# Patient Record
Sex: Female | Born: 1960 | Race: White | Hispanic: No | Marital: Single | Smoking: Former smoker
Health system: Southern US, Community
[De-identification: ages and names within clinical notes are randomized; demographics above are authoritative.]

## PROBLEM LIST (undated history)

## (undated) DIAGNOSIS — N200 Calculus of kidney: Secondary | ICD-10-CM

## (undated) HISTORY — PX: ECTOPIC PREGNANCY SURGERY: SHX613

---

## 1998-11-24 ENCOUNTER — Emergency Department (HOSPITAL_COMMUNITY): Admission: EM | Admit: 1998-11-24 | Discharge: 1998-11-24 | Payer: Self-pay | Admitting: *Deleted

## 1998-11-25 ENCOUNTER — Encounter: Payer: Self-pay | Admitting: *Deleted

## 2000-05-22 ENCOUNTER — Other Ambulatory Visit: Admission: RE | Admit: 2000-05-22 | Discharge: 2000-05-22 | Payer: Self-pay | Admitting: Family Medicine

## 2005-06-14 ENCOUNTER — Other Ambulatory Visit: Admission: RE | Admit: 2005-06-14 | Discharge: 2005-06-14 | Payer: Self-pay | Admitting: Family Medicine

## 2005-06-14 ENCOUNTER — Ambulatory Visit: Payer: Self-pay | Admitting: Family Medicine

## 2005-06-24 ENCOUNTER — Ambulatory Visit: Payer: Self-pay | Admitting: Family Medicine

## 2005-10-12 ENCOUNTER — Ambulatory Visit: Payer: Self-pay | Admitting: Family Medicine

## 2015-09-08 ENCOUNTER — Encounter (HOSPITAL_BASED_OUTPATIENT_CLINIC_OR_DEPARTMENT_OTHER): Payer: Self-pay

## 2015-09-08 ENCOUNTER — Emergency Department (HOSPITAL_BASED_OUTPATIENT_CLINIC_OR_DEPARTMENT_OTHER): Payer: 59

## 2015-09-08 ENCOUNTER — Emergency Department (HOSPITAL_BASED_OUTPATIENT_CLINIC_OR_DEPARTMENT_OTHER)
Admission: EM | Admit: 2015-09-08 | Discharge: 2015-09-08 | Disposition: A | Discharge status: Home / Self Care | Payer: 59 | Attending: Emergency Medicine | Admitting: Emergency Medicine

## 2015-09-08 DIAGNOSIS — Z87442 Personal history of urinary calculi: Secondary | ICD-10-CM | POA: Diagnosis not present

## 2015-09-08 DIAGNOSIS — Z3202 Encounter for pregnancy test, result negative: Secondary | ICD-10-CM | POA: Diagnosis not present

## 2015-09-08 DIAGNOSIS — Z87891 Personal history of nicotine dependence: Secondary | ICD-10-CM | POA: Diagnosis not present

## 2015-09-08 DIAGNOSIS — Z79899 Other long term (current) drug therapy: Secondary | ICD-10-CM | POA: Diagnosis not present

## 2015-09-08 DIAGNOSIS — R109 Unspecified abdominal pain: Secondary | ICD-10-CM

## 2015-09-08 DIAGNOSIS — M545 Low back pain: Secondary | ICD-10-CM | POA: Insufficient documentation

## 2015-09-08 HISTORY — DX: Calculus of kidney: N20.0

## 2015-09-08 LAB — CBC WITH DIFFERENTIAL/PLATELET
Basophils Absolute: 0 10*3/uL (ref 0.0–0.1)
Basophils Relative: 0 %
EOS PCT: 4 %
Eosinophils Absolute: 0.2 10*3/uL (ref 0.0–0.7)
HEMATOCRIT: 38.9 % (ref 36.0–46.0)
Hemoglobin: 12.8 g/dL (ref 12.0–15.0)
LYMPHS ABS: 2.6 10*3/uL (ref 0.7–4.0)
LYMPHS PCT: 39 %
MCH: 26.6 pg (ref 26.0–34.0)
MCHC: 32.9 g/dL (ref 30.0–36.0)
MCV: 80.7 fL (ref 78.0–100.0)
MONO ABS: 0.5 10*3/uL (ref 0.1–1.0)
MONOS PCT: 8 %
NEUTROS ABS: 3.3 10*3/uL (ref 1.7–7.7)
Neutrophils Relative %: 49 %
PLATELETS: 212 10*3/uL (ref 150–400)
RBC: 4.82 MIL/uL (ref 3.87–5.11)
RDW: 13 % (ref 11.5–15.5)
WBC: 6.7 10*3/uL (ref 4.0–10.5)

## 2015-09-08 LAB — BASIC METABOLIC PANEL
ANION GAP: 7 (ref 5–15)
BUN: 15 mg/dL (ref 6–20)
CO2: 26 mmol/L (ref 22–32)
Calcium: 8.9 mg/dL (ref 8.9–10.3)
Chloride: 104 mmol/L (ref 101–111)
Creatinine, Ser: 0.52 mg/dL (ref 0.44–1.00)
GFR calc Af Amer: >60 mL/min (ref >60)
GLUCOSE: 134 mg/dL — AB (ref 65–99)
POTASSIUM: 3.9 mmol/L (ref 3.5–5.1)
Sodium: 137 mmol/L (ref 135–145)

## 2015-09-08 LAB — URINALYSIS, ROUTINE W REFLEX MICROSCOPIC
Bilirubin Urine: NEGATIVE
Glucose, UA: NEGATIVE mg/dL
Hgb urine dipstick: NEGATIVE
Ketones, ur: NEGATIVE mg/dL
Leukocytes, UA: NEGATIVE
Nitrite: NEGATIVE
Protein, ur: NEGATIVE mg/dL
Specific Gravity, Urine: 1.023 (ref 1.005–1.030)
Urobilinogen, UA: 0.2 mg/dL (ref 0.0–1.0)
pH: 5 (ref 5.0–8.0)

## 2015-09-08 LAB — PREGNANCY, URINE: Preg Test, Ur: NEGATIVE

## 2015-09-08 MED ORDER — FENTANYL CITRATE (PF) 100 MCG/2ML IJ SOLN: 100.0000 ug | Freq: Once | INTRAMUSCULAR | Status: DC

## 2015-09-08 MED ORDER — KETOROLAC TROMETHAMINE 30 MG/ML IJ SOLN
30.0000 mg | Freq: Once | INTRAMUSCULAR | Status: AC
  Administered 2015-09-08: 30 mg via INTRAVENOUS
  Filled 2015-09-08: qty 1

## 2015-09-08 NOTE — ED Provider Notes (Signed)
CSN: [id number]     Arrival date & time 09/08/15  1302 History   First MD Initiated Contact with Patient 09/08/15 1316     Chief Complaint  Patient presents with  . Back Pain     (Consider location/radiation/quality/duration/timing/severity/associated sxs/prior Treatment) Patient is a 54 y.o. female presenting with back pain. The history is provided by the patient.  Back Pain Associated symptoms: no abdominal pain, no chest pain, no headaches, no numbness and no weakness    patient presents with back pain. Started on her left flank while cleaning today. States there was no injury. It has been severe. Worse with movements. No dysuria. States it feels somewhat like previous kidney stone she had. States she was seen at an outside hospital for previous stone. No chest pain. No trouble breathing. No fevers the pain is sharp and worse with movements. Nothing makes it go away although it is somewhat improved with sitting still.   Past Medical History  Diagnosis Date  . Kidney stone    Past Surgical History  Procedure Laterality Date  . Cesarean section    . Ectopic pregnancy surgery     No family history on file. Social History  Substance Use Topics  . Smoking status: Former Games developer  . Smokeless tobacco: None  . Alcohol Use: No   OB History    No data available     Review of Systems  Constitutional: Negative for activity change and appetite change.  Eyes: Negative for pain.  Respiratory: Negative for chest tightness and shortness of breath.   Cardiovascular: Negative for chest pain and leg swelling.  Gastrointestinal: Negative for nausea, vomiting, abdominal pain and diarrhea.  Genitourinary: Positive for flank pain.  Musculoskeletal: Positive for back pain. Negative for neck stiffness.  Skin: Negative for rash.  Neurological: Negative for weakness, numbness and headaches.  Psychiatric/Behavioral: Negative for behavioral problems.      Allergies  Vicodin  Home Medications    Prior to Admission medications   Medication Sig Start Date End Date Taking? Authorizing Provider  omeprazole (PRILOSEC) 40 MG capsule Take 40 mg by mouth daily.   Yes Historical Provider, MD  RaNITidine HCl (ZANTAC PO) Take by mouth.   Yes Historical Provider, MD   BP 131/54 mmHg  Pulse 74  Temp(Src) 97.6 F (36.4 C) (Oral)  Resp 18  Ht  (1.626 m)  Wt 170 lb (77.111 kg)  BMI 29.17 kg/m2  SpO2 97% Physical Exam  Constitutional: She is oriented to person, place, and time. She appears well-developed and well-nourished.  HENT:  Head: Normocephalic and atraumatic.  Eyes: Pupils are equal, round, and reactive to light.  Neck: Normal range of motion.  Cardiovascular: Normal rate, regular rhythm and normal heart sounds.   No murmur heard. Pulmonary/Chest: Effort normal and breath sounds normal. No respiratory distress. She has no wheezes. She has no rales.  Abdominal: Soft. Bowel sounds are normal. She exhibits no distension. There is no tenderness.  Genitourinary:  CVA tenderness on left.  Musculoskeletal: Normal range of motion.  Neurological: She is alert and oriented to person, place, and time. No cranial nerve deficit.  Skin: Skin is warm and dry.  Psychiatric: Her speech is normal.  Nursing note and vitals reviewed.   ED Course  Procedures (including critical care time) Labs Review Labs Reviewed  URINALYSIS, ROUTINE W REFLEX MICROSCOPIC (NOT AT [facility])  PREGNANCY, URINE  BASIC METABOLIC PANEL  CBC WITH DIFFERENTIAL/PLATELET    Imaging Review No results found. I have personally  reviewed and evaluated these images and lab results as part of my medical decision-making.   EKG Interpretation None      MDM   Final diagnoses:  Left flank pain   Patient with left flank pain. History of ureteral stones. Negative CT here. Feels better after treatment. Likely musculoskeletal. Will discharge home.    Benjiman Core, MD 09/08/15 1501

## 2015-09-08 NOTE — Discharge Instructions (Signed)
Flank Pain °Flank pain refers to pain that is located on the side of the body between the upper abdomen and the back. The pain may occur over a short period of time (acute) or may be long-term or reoccurring (chronic). It may be mild or severe. Flank pain can be caused by many things. °CAUSES  °Some of the more common causes of flank pain include: °· Muscle strains.   °· Muscle spasms.   °· A disease of your spine (vertebral disk disease).   °· A lung infection (pneumonia).   °· Fluid around your lungs (pulmonary edema).   °· A kidney infection.   °· Kidney stones.   °· A very painful skin rash caused by the chickenpox virus (shingles).   °· Gallbladder disease.   °HOME CARE INSTRUCTIONS  °Home care will depend on the cause of your pain. In general, °· Rest as directed by your caregiver. °· Drink enough fluids to keep your urine clear or pale yellow. °· Only take over-the-counter or prescription medicines as directed by your caregiver. Some medicines may help relieve the pain. °· Tell your caregiver about any changes in your pain. °· Follow up with your caregiver as directed. °SEEK IMMEDIATE MEDICAL CARE IF:  °· Your pain is not controlled with medicine.   °· You have new or worsening symptoms. °· Your pain increases.   °· You have abdominal pain.   °· You have shortness of breath.   °· You have persistent nausea or vomiting.   °· You have swelling in your abdomen.   °· You feel faint or pass out.   °· You have blood in your urine. °· You have a fever or persistent symptoms for more than 2-3 days. °· You have a fever and your symptoms suddenly get worse. °MAKE SURE YOU:  °· Understand these instructions. °· Will watch your condition. °· Will get help right away if you are not doing well or get worse. °Document Released: 01/26/2006 Document Revised: 08/29/2012 Document Reviewed: 07/19/2012 °Exit[employer]® Patient Information ©2015 [employer]. This information is not intended to replace advice given to you by your  health care provider. Make sure you discuss any questions you have with your health care provider. ° °

## 2015-09-08 NOTE — ED Notes (Addendum)
C/o left lower/mid back pain x 1 hour-started while cleaning house-states feels like kidney stone pain-pt did drive self to ED-presents to triage in w/c

## 2015-09-08 NOTE — ED Notes (Signed)
Pt states she does not have ride

## 2015-09-08 NOTE — ED Notes (Signed)
Patient transported to CT 

## 2015-11-16 ENCOUNTER — Encounter: Payer: Self-pay | Admitting: Podiatry

## 2015-11-16 ENCOUNTER — Ambulatory Visit (INDEPENDENT_AMBULATORY_CARE_PROVIDER_SITE_OTHER): Payer: 59

## 2015-11-16 ENCOUNTER — Ambulatory Visit (INDEPENDENT_AMBULATORY_CARE_PROVIDER_SITE_OTHER): Payer: 59 | Admitting: Podiatry

## 2015-11-16 VITALS — BP 128/76 | HR 74 | Resp 12

## 2015-11-16 DIAGNOSIS — M2022 Hallux rigidus, left foot: Secondary | ICD-10-CM | POA: Diagnosis not present

## 2015-11-16 DIAGNOSIS — R52 Pain, unspecified: Secondary | ICD-10-CM

## 2015-11-16 DIAGNOSIS — M2021 Hallux rigidus, right foot: Secondary | ICD-10-CM | POA: Diagnosis not present

## 2015-11-16 DIAGNOSIS — M722 Plantar fascial fibromatosis: Secondary | ICD-10-CM | POA: Diagnosis not present

## 2015-11-16 MED ORDER — MELOXICAM 15 MG PO TABS: 15.0000 mg | ORAL_TABLET | Freq: Every day | ORAL | Status: DC

## 2015-11-16 NOTE — Progress Notes (Signed)
   Subjective:    Patient ID: Grace Navarro, female    DOB: 1961/05/03, 54 y.o.   MRN: [id number]  HPI this 54 year old female presents to the office with chief complaint of pain noted in her right heel. She states that the pain has been present for approximately 2 months. She states pain is present upon rising in the morning and standing from a sitting position. She states she has throbbing pain noted in the evening. She has been performing stretches recommended by her medical doctor. He also recommended that she make an appointment for an evaluation and treatment of her right heel. She denies any history of trauma or injury to the foot and presents the office for an evaluation and treatment Patient is here today with right heel pain.   Review of Systems  Neurological: Positive for headaches.  Hematological: Bruises/bleeds easily.       Objective:   Physical Exam GENERAL APPEARANCE: Alert, conversant. Appropriately groomed. No acute distress.  VASCULAR: Pedal pulses palpable at  [facility] bilateral.  Capillary refill time is immediate to all digits,  Normal temperature gradient.  Digital hair growth is present bilateral  NEUROLOGIC: sensation is normal to 5.07 monofilament at 5/5 sites bilateral.  Light touch is intact bilateral, Muscle strength normal.  MUSCULOSKELETAL: acceptable muscle strength, tone and stability bilateral.  Intrinsic muscluature intact bilateral.  Rectus appearance of foot and digits noted bilateral. Palpable pain at the insertion plantar fascia right heel.  Swelling localized right heel.  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  DERMATOLOGIC: skin color, texture, and turgor are within normal limits.  No preulcerative lesions or ulcers  are seen, no interdigital maceration noted.  No open lesions present.  Digital nails are asymptomatic. No drainage noted.         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  Prescribed Mobic.  Injection therapy right heel.  Stretching exercises discussed.  RTC 2 weeks.  X-rays reveal calcification plantar fascia right heel with long first metatarsal right foot. RTC 2 weeks.  Helane GuntherGregory Kassia Demarinis DPM

## 2015-11-30 ENCOUNTER — Ambulatory Visit (INDEPENDENT_AMBULATORY_CARE_PROVIDER_SITE_OTHER): Payer: 59 | Admitting: Podiatry

## 2015-11-30 ENCOUNTER — Encounter: Payer: Self-pay | Admitting: Podiatry

## 2015-11-30 VITALS — BP 142/72 | HR 75 | Resp 14

## 2015-11-30 DIAGNOSIS — M722 Plantar fascial fibromatosis: Secondary | ICD-10-CM

## 2015-11-30 NOTE — Progress Notes (Signed)
Subjective:     Patient ID: Grace Navarro, female   DOB: [birth year], 54 y.o.   MRN: [id number]  HPI this patient returns to the office follow-up for diagnosis of plantar fasciitis right foot. She has been taking Mobic and received an injection in addition to wearing her pure stride insoles. She states that she is doing much better and even says 100% improvement. She is not having pain after work nor in the morning upon rising. She is very pleased with her progress Review of Systems     Objective:   Physical Exam Physical Exam GENERAL APPEARANCE: Alert, conversant. Appropriately groomed. No acute distress.  VASCULAR: [facility]. Capillary refill time is immediate to all digits, Normal temperature gradient. Digital hair growth is present bilateral  NEUROLOGIC: sensation is normal to 5.07 monofilament at 5/5 sites bilateral. Light touch is intact bilateral, Muscle strength normal.  MUSCULOSKELETAL: acceptable muscle strength, tone and stability bilateral. Intrinsic muscluature intact bilateral. Rectus appearance of foot and digits noted bilateral. Swelling and pain right heel has resolved. 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  DERMATOLOGIC: skin color, texture, and turgor are within normal limits. No preulcerative lesions or ulcers are seen, no interdigital maceration noted. No open lesions present. Digital nails are asymptomatic. No drainage noted.    Assessment:     Plantat fascitis right heel     Plan:     ROV  Discussed orthotic therapy.  RTC prn.  Helane GuntherGregory Kwinton Maahs DPM

## 2017-04-26 ENCOUNTER — Ambulatory Visit: Payer: Self-pay | Admitting: Sports Medicine

## 2018-07-28 ENCOUNTER — Emergency Department (HOSPITAL_COMMUNITY): Payer: Self-pay

## 2018-07-28 ENCOUNTER — Encounter (HOSPITAL_COMMUNITY): Payer: Self-pay

## 2018-07-28 ENCOUNTER — Emergency Department (HOSPITAL_COMMUNITY)
Admission: EM | Admit: 2018-07-28 | Discharge: 2018-07-28 | Disposition: A | Discharge status: Home / Self Care | Payer: Self-pay | Attending: Emergency Medicine | Admitting: Emergency Medicine

## 2018-07-28 ENCOUNTER — Other Ambulatory Visit: Payer: Self-pay

## 2018-07-28 DIAGNOSIS — Z87891 Personal history of nicotine dependence: Secondary | ICD-10-CM | POA: Insufficient documentation

## 2018-07-28 DIAGNOSIS — Z79899 Other long term (current) drug therapy: Secondary | ICD-10-CM | POA: Insufficient documentation

## 2018-07-28 DIAGNOSIS — Z7984 Long term (current) use of oral hypoglycemic drugs: Secondary | ICD-10-CM | POA: Insufficient documentation

## 2018-07-28 DIAGNOSIS — R109 Unspecified abdominal pain: Secondary | ICD-10-CM

## 2018-07-28 DIAGNOSIS — R1030 Lower abdominal pain, unspecified: Secondary | ICD-10-CM | POA: Insufficient documentation

## 2018-07-28 LAB — CBC
HEMATOCRIT: 44.9 % (ref 36.0–46.0)
Hemoglobin: 14.2 g/dL (ref 12.0–15.0)
MCH: 25.9 pg — ABNORMAL LOW (ref 26.0–34.0)
MCHC: 31.6 g/dL (ref 30.0–36.0)
MCV: 81.9 fL (ref 78.0–100.0)
Platelets: 255 10*3/uL (ref 150–400)
RBC: 5.48 MIL/uL — ABNORMAL HIGH (ref 3.87–5.11)
RDW: 13.3 % (ref 11.5–15.5)
WBC: 9.5 10*3/uL (ref 4.0–10.5)

## 2018-07-28 LAB — COMPREHENSIVE METABOLIC PANEL
ALBUMIN: 3.9 g/dL (ref 3.5–5.0)
ALK PHOS: 85 U/L (ref 38–126)
ALT: 23 U/L (ref 0–44)
AST: 17 U/L (ref 15–41)
Anion gap: 10 (ref 5–15)
BILIRUBIN TOTAL: 0.7 mg/dL (ref 0.3–1.2)
BUN: 8 mg/dL (ref 6–20)
CALCIUM: 9.5 mg/dL (ref 8.9–10.3)
CO2: 23 mmol/L (ref 22–32)
Chloride: 104 mmol/L (ref 98–111)
Creatinine, Ser: 0.54 mg/dL (ref 0.44–1.00)
GFR calc Af Amer: >60 mL/min (ref >60)
GLUCOSE: 141 mg/dL — AB (ref 70–99)
POTASSIUM: 4 mmol/L (ref 3.5–5.1)
Sodium: 137 mmol/L (ref 135–145)
TOTAL PROTEIN: 6.9 g/dL (ref 6.5–8.1)

## 2018-07-28 LAB — URINALYSIS, ROUTINE W REFLEX MICROSCOPIC
BILIRUBIN URINE: NEGATIVE
Glucose, UA: NEGATIVE mg/dL
Hgb urine dipstick: NEGATIVE
KETONES UR: NEGATIVE mg/dL
LEUKOCYTES UA: NEGATIVE
NITRITE: NEGATIVE
Protein, ur: NEGATIVE mg/dL
Specific Gravity, Urine: 1.02 (ref 1.005–1.030)
pH: 5 (ref 5.0–8.0)

## 2018-07-28 LAB — LIPASE, BLOOD: Lipase: 27 U/L (ref 11–51)

## 2018-07-28 LAB — I-STAT BETA HCG BLOOD, ED (MC, WL, AP ONLY)

## 2018-07-28 MED ORDER — HYDROMORPHONE HCL 1 MG/ML IJ SOLN
0.5000 mg | Freq: Once | INTRAMUSCULAR | Status: AC
  Administered 2018-07-28: 0.5 mg via INTRAVENOUS
  Filled 2018-07-28: qty 1

## 2018-07-28 MED ORDER — IOHEXOL 300 MG/ML  SOLN
100.0000 mL | Freq: Once | INTRAMUSCULAR | Status: AC | PRN
  Administered 2018-07-28: 100 mL via INTRAVENOUS

## 2018-07-28 MED ORDER — PROMETHAZINE HCL 25 MG/ML IJ SOLN
12.5000 mg | Freq: Once | INTRAMUSCULAR | Status: AC
  Administered 2018-07-28: 12.5 mg via INTRAVENOUS
  Filled 2018-07-28: qty 1

## 2018-07-28 MED ORDER — ONDANSETRON HCL 4 MG/2ML IJ SOLN
4.0000 mg | Freq: Once | INTRAMUSCULAR | Status: AC
  Administered 2018-07-28: 4 mg via INTRAVENOUS
  Filled 2018-07-28: qty 2

## 2018-07-28 MED ORDER — SODIUM CHLORIDE 0.9 % IV BOLUS
1000.0000 mL | Freq: Once | INTRAVENOUS | Status: AC
Start: 2018-07-28 — End: 2018-07-28
  Administered 2018-07-28: 1000 mL via INTRAVENOUS

## 2018-07-28 MED ORDER — FAMOTIDINE IN NACL 20-0.9 MG/50ML-% IV SOLN
20.0000 mg | Freq: Once | INTRAVENOUS | Status: AC
  Administered 2018-07-28: 20 mg via INTRAVENOUS
  Filled 2018-07-28: qty 50

## 2018-07-28 MED ORDER — SODIUM CHLORIDE 0.9 % IV BOLUS
1000.0000 mL | Freq: Once | INTRAVENOUS | Status: AC
  Administered 2018-07-28: 1000 mL via INTRAVENOUS

## 2018-07-28 MED ORDER — ONDANSETRON HCL 8 MG PO TABS: 8.0000 mg | ORAL_TABLET | Freq: Three times a day (TID) | ORAL | 0 refills | Status: DC | PRN

## 2018-07-28 NOTE — Discharge Instructions (Addendum)
CT scan showed no acute findings.  Prescription for nausea.  Recommend clear liquids for the next 12 hours.  Return if worse.

## 2018-07-28 NOTE — ED Triage Notes (Signed)
Onset 2am pt woke up suddenly with lower abd pain, headache, and nausea.  Pt took Ibuprofen with no relief.  No dysuria, but pt reports increased urination.  Pt tearful at triage.

## 2018-07-28 NOTE — ED Notes (Signed)
EDP at bedside  

## 2018-07-28 NOTE — ED Notes (Signed)
Patient verbalizes understanding of discharge instructions. Opportunity for questioning and answers were provided. Armband removed by staff, pt discharged from ED.  

## 2018-07-28 NOTE — ED Provider Notes (Signed)
[facility] EMERGENCY DEPARTMENT Provider Note   CSN: [id number] Arrival date & time: 07/28/18  1214     History   Chief Complaint Chief Complaint  Patient presents with  . Abdominal Pain    HPI Grace Navarro is a 57 y.o. female.  Suprapubic abdominal pain since 2 AM this morning without dysuria, hematuria, fever, sweats, chills, diarrhea, vomiting, vaginal bleeding, vaginal discharge.  Past surgical history includes cesarean section and ectopic pregnancy.  Severity of symptoms is moderate.  Nothing makes symptoms better or worse.     Past Medical History:  Diagnosis Date  . Kidney stone     There are no active problems to display for this patient.   Past Surgical History:  Procedure Laterality Date  . CESAREAN SECTION    . ECTOPIC PREGNANCY SURGERY       OB History   None      Home Medications    Prior to Admission medications   Medication Sig Start Date End Date Taking? Authorizing Provider  calcium carbonate (CALCIUM 600) 1500 (600 Ca) MG TABS tablet Take 600 mg of elemental calcium by mouth daily with breakfast.   Yes [provider]  metFORMIN (GLUCOPHAGE) 1000 MG tablet Take 1,000 mg by mouth 2 (two) times daily with a meal.    Yes [provider]  Multiple Vitamin (MULTIVITAMIN WITH MINERALS) TABS tablet Take 1 tablet by mouth daily.   Yes [provider]  omeprazole (PRILOSEC) 40 MG capsule Take 40 mg by mouth daily.   Yes [provider]  ranitidine (ZANTAC) 150 MG tablet Take 300 mg by mouth daily.    Yes [provider]  vitamin B-12 (CYANOCOBALAMIN) 1000 MCG tablet Take 3,000 mcg by mouth daily.   Yes [provider]  meloxicam (MOBIC) 15 MG tablet Take 1 tablet (15 mg total) by mouth daily. Patient not taking: Reported on 07/28/2018 11/16/15   Helane Gunther, DPM  ondansetron [facility]) 8 MG tablet Take 1 tablet (8 mg total) by mouth every 8 (eight) hours as needed for  nausea or vomiting. 07/28/18   Donnetta Hutching, MD    Family History History reviewed. No pertinent family history.  Social History Social History   Tobacco Use  . Smoking status: Former Games developer  . Smokeless tobacco: Never Used  Substance Use Topics  . Alcohol use: No  . Drug use: No     Allergies   Vicodin [hydrocodone-acetaminophen]   Review of Systems Review of Systems  All other systems reviewed and are negative.    Physical Exam Updated Vital Signs BP 123/75   Pulse 63   Temp 98 F (36.7 C) (Oral)   Resp 18   Ht 5\' 2"  (1.575 m)   Wt 78 kg   SpO2 95%   BMI 31.46 kg/m   Physical Exam  Constitutional: She is oriented to person, place, and time. She appears well-developed and well-nourished.  HENT:  Head: Normocephalic and atraumatic.  Eyes: Conjunctivae are normal.  Neck: Neck supple.  Cardiovascular: Normal rate and regular rhythm.  Pulmonary/Chest: Effort normal and breath sounds normal.  Abdominal: Soft. Bowel sounds are normal.  Minimal tenderness suprapubic area.  Musculoskeletal: Normal range of motion.  Neurological: She is alert and oriented to person, place, and time.  Skin: Skin is warm and dry.  Psychiatric: She has a normal mood and affect. Her behavior is normal.  Nursing note and vitals reviewed.    ED Treatments / Results  Labs (all  labs ordered are listed, but only abnormal results are displayed) Labs Reviewed  COMPREHENSIVE METABOLIC PANEL - Abnormal; Notable for the following components:      Result Value   Glucose, Bld 141 (*)    All other components within normal limits  CBC - Abnormal; Notable for the following components:   RBC 5.48 (*)    MCH 25.9 (*)    All other components within normal limits  LIPASE, BLOOD  URINALYSIS, ROUTINE W REFLEX MICROSCOPIC  I-STAT BETA HCG BLOOD, ED ([facility], [facility], [facility] ONLY)    EKG None  Radiology Ct Abdomen Pelvis W Contrast  Result Date: 07/28/2018 CLINICAL DATA:  Severe suprapubic abdomen  pain. EXAM: CT ABDOMEN AND PELVIS WITH CONTRAST TECHNIQUE: Multidetector CT imaging of the abdomen and pelvis was performed using the standard protocol following bolus administration of intravenous contrast. CONTRAST:  100mL OMNIPAQUE IOHEXOL 300 MG/ML  SOLN COMPARISON:  September 08, 2015 FINDINGS: Lower chest: No acute abnormality. Hepatobiliary: There is mild diffuse low density of the liver without vessel displacement. No focal liver lesions identified. The gallbladder is normal. The biliary tree is normal. Pancreas: Unremarkable. No pancreatic ductal dilatation or surrounding inflammatory changes. Spleen: Normal in size without focal abnormality. 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 The right adrenal gland is normal. There are simple cysts in both kidneys. There is no hydronephrosis bilaterally. The bladder is normal. Stomach/Bowel: There is a small hiatal hernia. Stomach is otherwise within normal limits. Appendix appears normal. No evidence of bowel wall thickening, distention, or inflammatory changes. There is diverticulosis of colon. Vascular/Lymphatic: Aortic atherosclerosis. No enlarged abdominal or pelvic lymph nodes. Reproductive: Uterus and bilateral adnexa are unremarkable. Other: None. Musculoskeletal: Degenerative joint changes of the spine are noted. IMPRESSION: No bowel obstruction or diverticulitis.  The appendix is normal. No acute abnormality identified in the abdomen and pelvis. Electronically Signed   By: Sherian ReinWei-Chen  Lin M.D.   On: 07/28/2018 16:33    Procedures Procedures (including critical care time)  Medications Ordered in ED Medications  sodium chloride 0.9 % bolus 1,000 mL (0 mLs Intravenous Stopped 07/28/18 1506)  ondansetron (ZOFRAN) injection 4 mg (4 mg Intravenous Given 07/28/18 1353)  HYDROmorphone (DILAUDID) injection 0.5 mg (0.5 mg Intravenous Given 07/28/18 1353)  iohexol (OMNIPAQUE) 300 MG/ML solution 100 mL (100 mLs  Intravenous Contrast Given 07/28/18 1451)  ondansetron (ZOFRAN) injection 4 mg (4 mg Intravenous Given 07/28/18 1728)  sodium chloride 0.9 % bolus 1,000 mL (0 mLs Intravenous Stopped 07/28/18 1943)  famotidine (PEPCID) IVPB 20 mg premix (0 mg Intravenous Stopped 07/28/18 1833)  promethazine (PHENERGAN) injection 12.5 mg (12.5 mg Intravenous Given 07/28/18 1748)     Initial Impression / Assessment and Plan / ED Course  I have reviewed the triage vital signs and the nursing notes.  Pertinent labs & imaging results that were available during my care of the patient were reviewed by me and considered in my medical decision making (see chart for details).     Patient presents with suprapubic abdominal pain.  White count, chemistry panel, urinalysis, pregnancy test, CT abdomen/pelvis all negative.  Patient feels much better after IV fluids, pain management, IV Pepcid, IV Zofran, IV Phenergan.  No acute abdomen at discharge.  Charge medications Zofran 8 mg  Final Clinical Impressions(s) / ED Diagnoses   Final diagnoses:  Abdominal pain, unspecified abdominal location    ED Discharge Orders         Ordered    ondansetron (ZOFRAN) 8 MG tablet  Every 8 hours PRN     07/28/18 2050           Donnetta Hutching, MD 07/28/18 2054

## 2018-07-28 NOTE — ED Notes (Signed)
Patient transported to CT 

## 2019-01-07 DIAGNOSIS — R1013 Epigastric pain: Secondary | ICD-10-CM | POA: Diagnosis not present

## 2019-01-07 DIAGNOSIS — Z6832 Body mass index (BMI) 32.0-32.9, adult: Secondary | ICD-10-CM | POA: Diagnosis not present

## 2019-01-07 DIAGNOSIS — Z1231 Encounter for screening mammogram for malignant neoplasm of breast: Secondary | ICD-10-CM | POA: Diagnosis not present

## 2019-01-07 DIAGNOSIS — K219 Gastro-esophageal reflux disease without esophagitis: Secondary | ICD-10-CM | POA: Diagnosis not present

## 2019-01-31 DIAGNOSIS — R12 Heartburn: Secondary | ICD-10-CM | POA: Diagnosis not present

## 2019-02-19 DIAGNOSIS — Z6832 Body mass index (BMI) 32.0-32.9, adult: Secondary | ICD-10-CM | POA: Diagnosis not present

## 2019-02-19 DIAGNOSIS — J069 Acute upper respiratory infection, unspecified: Secondary | ICD-10-CM | POA: Diagnosis not present

## 2019-03-05 DIAGNOSIS — H1032 Unspecified acute conjunctivitis, left eye: Secondary | ICD-10-CM | POA: Diagnosis not present

## 2019-03-05 DIAGNOSIS — G43009 Migraine without aura, not intractable, without status migrainosus: Secondary | ICD-10-CM | POA: Diagnosis not present

## 2019-03-05 DIAGNOSIS — Z6831 Body mass index (BMI) 31.0-31.9, adult: Secondary | ICD-10-CM | POA: Diagnosis not present

## 2019-03-14 DIAGNOSIS — K219 Gastro-esophageal reflux disease without esophagitis: Secondary | ICD-10-CM | POA: Diagnosis not present

## 2019-04-16 ENCOUNTER — Telehealth: Payer: Self-pay | Admitting: Internal Medicine

## 2019-05-14 DIAGNOSIS — E785 Hyperlipidemia, unspecified: Secondary | ICD-10-CM | POA: Diagnosis not present

## 2019-05-14 DIAGNOSIS — E119 Type 2 diabetes mellitus without complications: Secondary | ICD-10-CM | POA: Diagnosis not present

## 2019-05-14 DIAGNOSIS — G43009 Migraine without aura, not intractable, without status migrainosus: Secondary | ICD-10-CM | POA: Diagnosis not present

## 2019-05-14 DIAGNOSIS — K219 Gastro-esophageal reflux disease without esophagitis: Secondary | ICD-10-CM | POA: Diagnosis not present

## 2019-11-11 DIAGNOSIS — E119 Type 2 diabetes mellitus without complications: Secondary | ICD-10-CM | POA: Diagnosis not present

## 2019-11-11 DIAGNOSIS — Z23 Encounter for immunization: Secondary | ICD-10-CM | POA: Diagnosis not present

## 2019-11-11 DIAGNOSIS — E785 Hyperlipidemia, unspecified: Secondary | ICD-10-CM | POA: Diagnosis not present

## 2019-11-11 DIAGNOSIS — F418 Other specified anxiety disorders: Secondary | ICD-10-CM | POA: Diagnosis not present

## 2019-11-11 DIAGNOSIS — K219 Gastro-esophageal reflux disease without esophagitis: Secondary | ICD-10-CM | POA: Diagnosis not present

## 2020-02-11 IMAGING — CT CT ABD-PELV W/ CM
2 of 5 series · 17 of 46 positions shown, 19 images · IV contrast (Omni 300)
Comparison: September 08, 2015

CLINICAL DATA: Severe suprapubic abdomen pain.

EXAM:
CT ABDOMEN AND PELVIS WITH CONTRAST
TECHNIQUE: Multidetector CT imaging of the abdomen and pelvis was performed
using the standard protocol following bolus administration of
intravenous contrast.
CONTRAST:  100mL OMNIPAQUE IOHEXOL 300 MG/ML  SOLN

[Series 3: a/p w/ 5mm · axial · 0.89mm/px · z∈[-281,+139]mm · 14 of 96 slices shown, 16 images]
[im 6/96  soft-tissue]
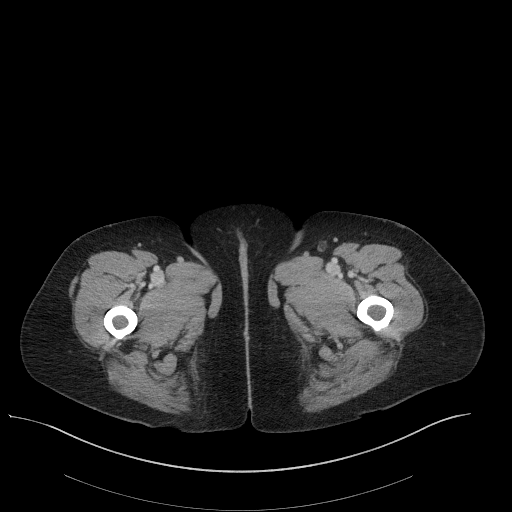
[im 6/96  bone]
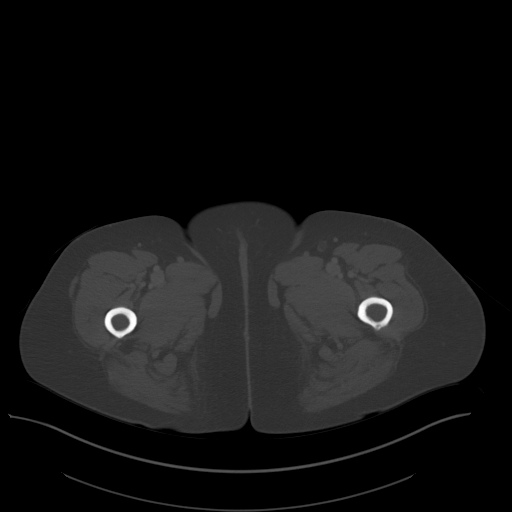
[im 12/96  soft-tissue]
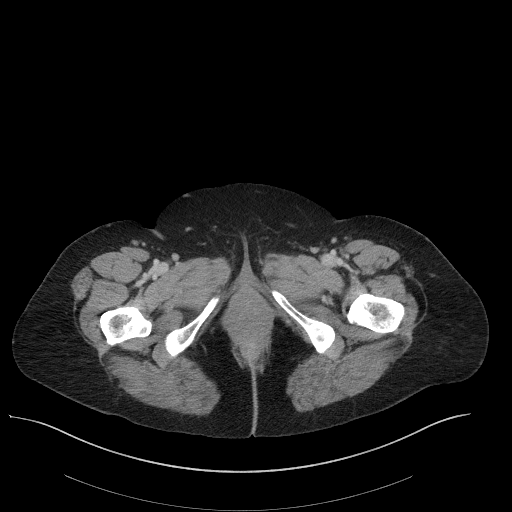
[im 17/96  soft-tissue]
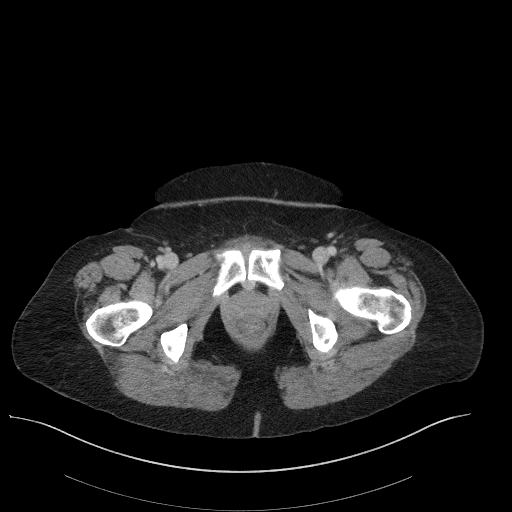
[im 28/96  soft-tissue]
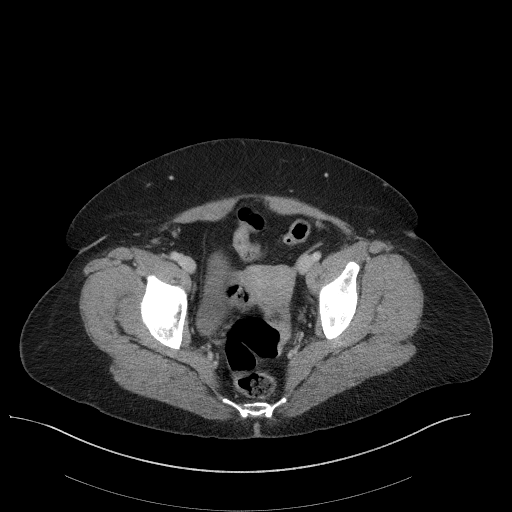
[im 34/96  soft-tissue]
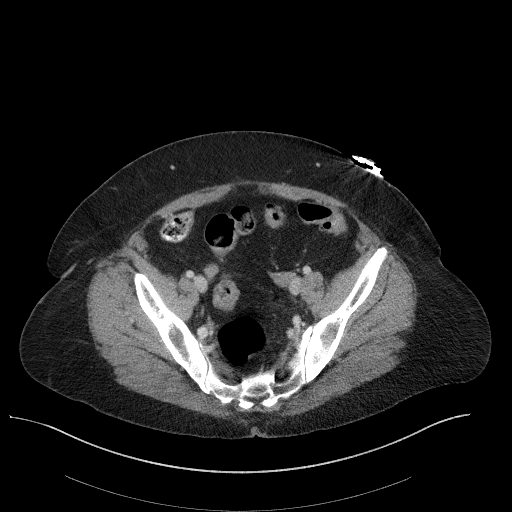
[im 40/96  soft-tissue]
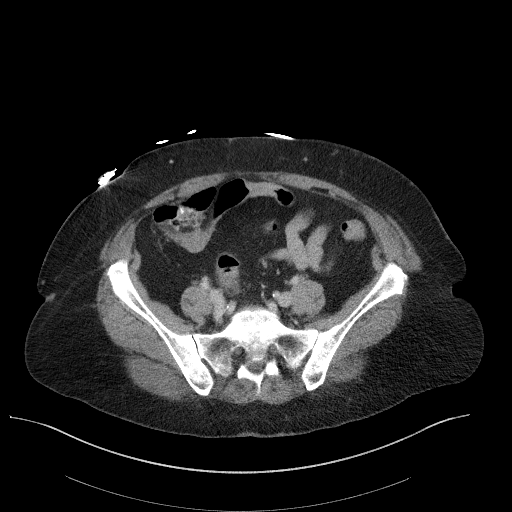
[im 45/96  soft-tissue]
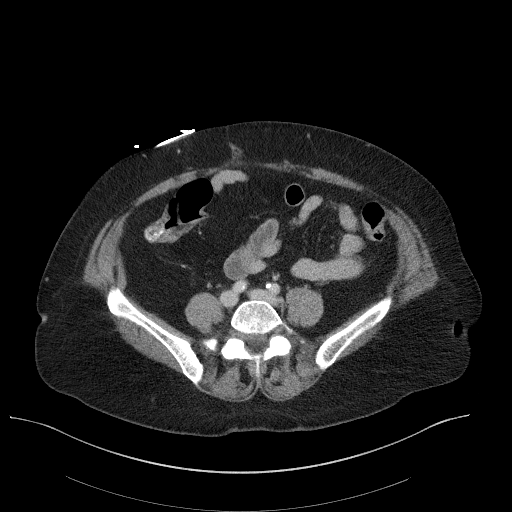
[im 51/96  soft-tissue]
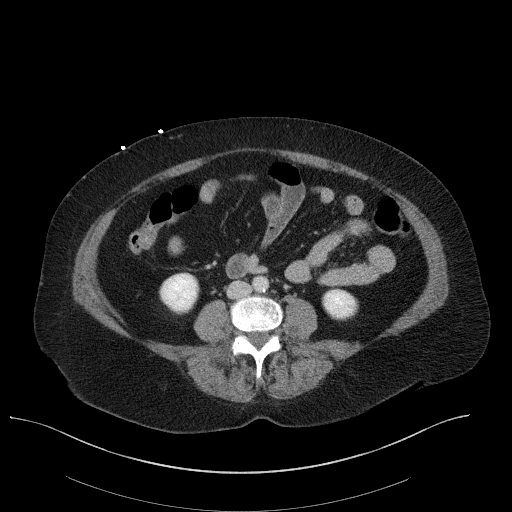
[im 56/96  soft-tissue]
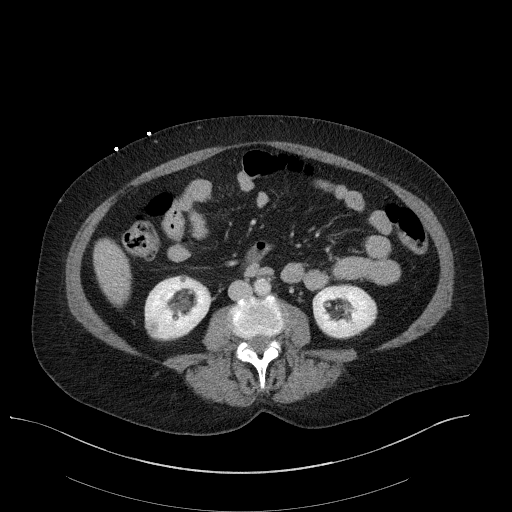
[im 56/96  bone]
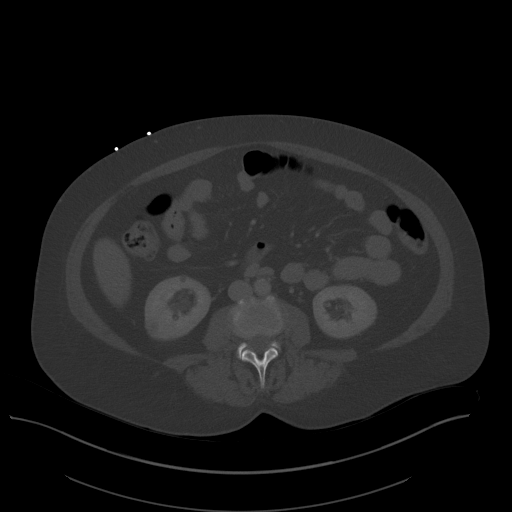
[im 62/96  soft-tissue]
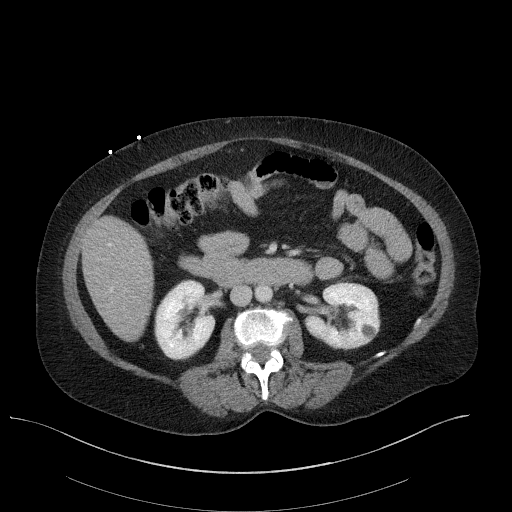
[im 73/96  soft-tissue]
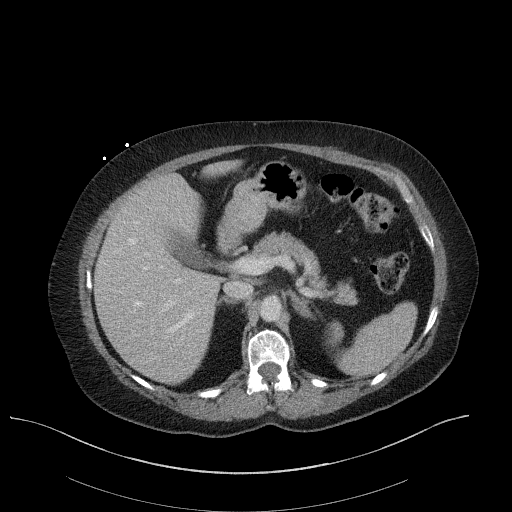
[im 79/96  soft-tissue]
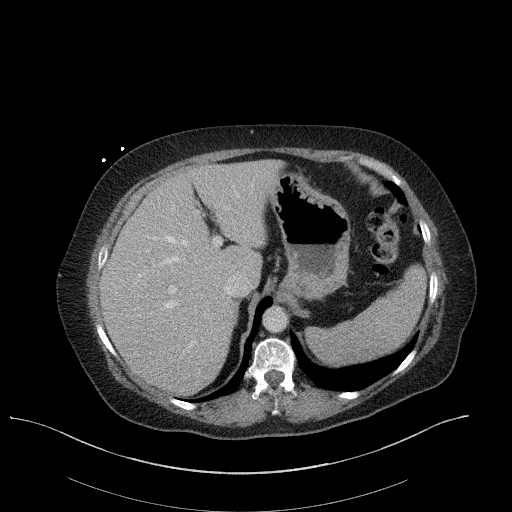
[im 84/96  soft-tissue]
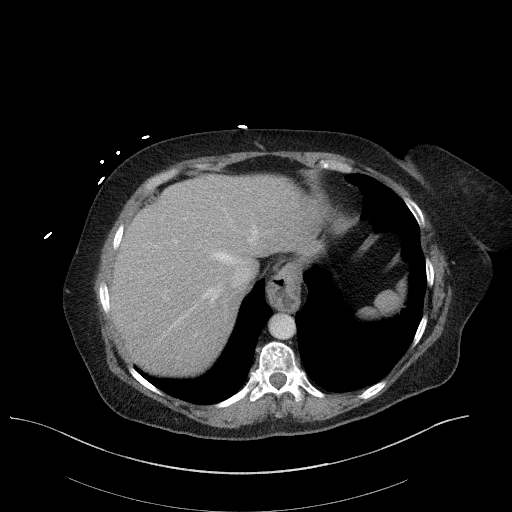
[im 90/96  soft-tissue]
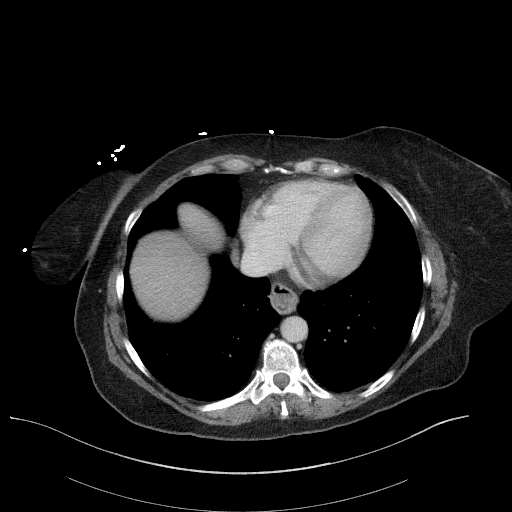

[Series 6: a/p w/ cor · coronal · 0.87mm/px · 3 of 162 slices shown]
[im 54/162  soft-tissue]
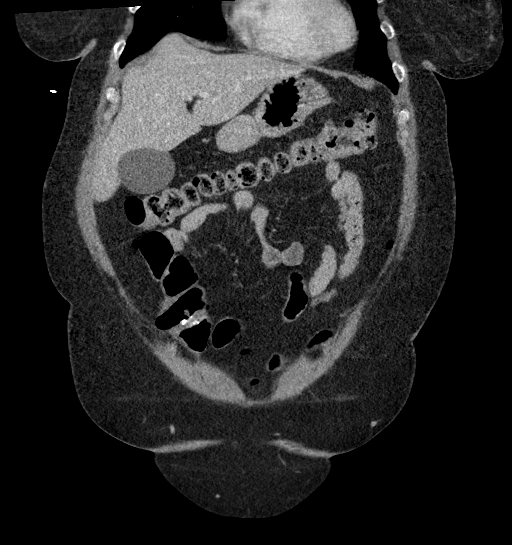
[im 72/162  soft-tissue]
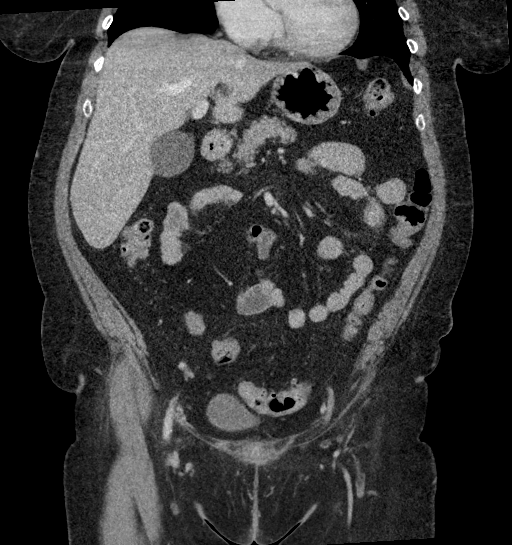
[im 90/162  soft-tissue]
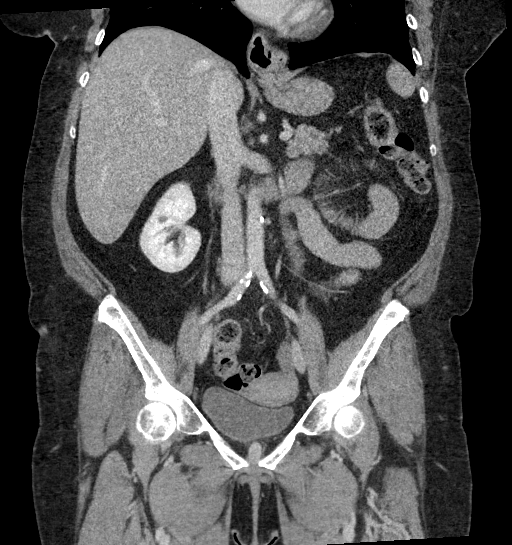

[17 of 46 positions shown; findings below may reference images not displayed]

FINDINGS: Lower chest: No acute abnormality.

Hepatobiliary: There is mild diffuse low density of the liver
without vessel displacement. No focal liver lesions identified. The
gallbladder is normal. The biliary tree is normal.

Pancreas: Unremarkable. No pancreatic ductal dilatation or
surrounding inflammatory changes.

Spleen: Normal in size without focal abnormality.

Adrenals/Urinary Tract: There is hypertrophy of the left adrenal
gland unchanged. The right adrenal gland is normal. There are simple
cysts in both kidneys. There is no hydronephrosis bilaterally. The
bladder is normal.

Stomach/Bowel: There is a small hiatal hernia. Stomach is otherwise
within normal limits. Appendix appears normal. No evidence of bowel
wall thickening, distention, or inflammatory changes. There is
diverticulosis of colon.

Vascular/Lymphatic: Aortic atherosclerosis. No enlarged abdominal or
pelvic lymph nodes.

Reproductive: Uterus and bilateral adnexa are unremarkable.

Other: None.

Musculoskeletal: Degenerative joint changes of the spine are noted.
IMPRESSION: No bowel obstruction or diverticulitis.  The appendix is normal.

No acute abnormality identified in the abdomen and pelvis.

## 2022-04-22 ENCOUNTER — Ambulatory Visit: Payer: PRIVATE HEALTH INSURANCE | Admitting: Podiatry

## 2022-04-22 DIAGNOSIS — L600 Ingrowing nail: Secondary | ICD-10-CM

## 2022-04-22 NOTE — Progress Notes (Signed)
Subjective:  ? ?Patient ID: Grace Navarro, female   DOB: 61 y.o.   MRN: [id number]  ? ?HPI ?Patient presents with chronic ingrown toenail of the right big toe stating its been very sore and making it hard for her to wear shoe gear properly.  States she is tried different treatments without relief and patient does not smoke likes to be active if possible ? ? ?Review of Systems  ?All other systems reviewed and are negative. ? ? ?   ?Objective:  ?Physical Exam ?Vitals and nursing note reviewed.  ?Constitutional:   ?   Appearance: She is well-developed.  ?Pulmonary:  ?   Effort: Pulmonary effort is normal.  ?Musculoskeletal:     ?   General: Normal range of motion.  ?Skin: ?   General: Skin is warm.  ?Neurological:  ?   Mental Status: She is alert.  ?  ?Neurovascular status intact muscle strength adequate range of motion within normal limits.  Patient is noted to have an incurvated lateral border right hallux painful when pressed with redness of the localized nature no drainage no other pathology.  Good digital perfusion well oriented x3 ? ?   ?Assessment:  ?Ingrown toenail deformity right hallux lateral border with pain ? ?   ?Plan:  ?H&P reviewed condition recommended correction of deformity.  Patient wants procedure understands procedure and risk and at this point signed consent form and I infiltrated the right hallux 60 mg like Marcaine mixture sterile prep done using sterile instrumentation and the lateral border exposed matrix applied phenol 3 applications 30 seconds followed by alcohol of sterile dressing gave instructions on soaks and to leave dressing on 24 hours but take it off earlier if any throbbing were to occur and encouraged her to call questions concerns which may arise ?   ? ? ?

## 2024-02-28 DIAGNOSIS — K2270 Barrett's esophagus without dysplasia: Secondary | ICD-10-CM | POA: Diagnosis not present

## 2024-02-28 DIAGNOSIS — D122 Benign neoplasm of ascending colon: Secondary | ICD-10-CM | POA: Diagnosis not present

## 2024-02-28 DIAGNOSIS — K920 Hematemesis: Secondary | ICD-10-CM | POA: Diagnosis not present

## 2024-02-28 DIAGNOSIS — K317 Polyp of stomach and duodenum: Secondary | ICD-10-CM | POA: Diagnosis not present

## 2024-02-28 DIAGNOSIS — K449 Diaphragmatic hernia without obstruction or gangrene: Secondary | ICD-10-CM | POA: Diagnosis not present

## 2024-02-28 DIAGNOSIS — Z860100 Personal history of colon polyps, unspecified: Secondary | ICD-10-CM | POA: Diagnosis not present

## 2024-02-28 DIAGNOSIS — E119 Type 2 diabetes mellitus without complications: Secondary | ICD-10-CM | POA: Diagnosis not present

## 2024-02-28 DIAGNOSIS — D124 Benign neoplasm of descending colon: Secondary | ICD-10-CM | POA: Diagnosis not present

## 2024-02-28 DIAGNOSIS — K644 Residual hemorrhoidal skin tags: Secondary | ICD-10-CM | POA: Diagnosis not present

## 2024-04-26 DIAGNOSIS — E669 Obesity, unspecified: Secondary | ICD-10-CM | POA: Diagnosis not present

## 2024-04-26 DIAGNOSIS — G43009 Migraine without aura, not intractable, without status migrainosus: Secondary | ICD-10-CM | POA: Diagnosis not present

## 2024-04-26 DIAGNOSIS — E1169 Type 2 diabetes mellitus with other specified complication: Secondary | ICD-10-CM | POA: Diagnosis not present

## 2024-04-26 DIAGNOSIS — E559 Vitamin D deficiency, unspecified: Secondary | ICD-10-CM | POA: Diagnosis not present

## 2024-04-26 DIAGNOSIS — Z6830 Body mass index (BMI) 30.0-30.9, adult: Secondary | ICD-10-CM | POA: Diagnosis not present

## 2024-04-26 DIAGNOSIS — E785 Hyperlipidemia, unspecified: Secondary | ICD-10-CM | POA: Diagnosis not present

## 2024-04-26 DIAGNOSIS — G629 Polyneuropathy, unspecified: Secondary | ICD-10-CM | POA: Diagnosis not present

## 2024-04-29 DIAGNOSIS — E785 Hyperlipidemia, unspecified: Secondary | ICD-10-CM | POA: Diagnosis not present

## 2024-04-29 DIAGNOSIS — E1169 Type 2 diabetes mellitus with other specified complication: Secondary | ICD-10-CM | POA: Diagnosis not present

## 2024-04-29 DIAGNOSIS — M778 Other enthesopathies, not elsewhere classified: Secondary | ICD-10-CM | POA: Diagnosis not present

## 2024-05-31 DIAGNOSIS — K2270 Barrett's esophagus without dysplasia: Secondary | ICD-10-CM | POA: Diagnosis not present

## 2024-07-29 DIAGNOSIS — G43009 Migraine without aura, not intractable, without status migrainosus: Secondary | ICD-10-CM | POA: Diagnosis not present

## 2024-07-29 DIAGNOSIS — K2270 Barrett's esophagus without dysplasia: Secondary | ICD-10-CM | POA: Diagnosis not present

## 2024-07-29 DIAGNOSIS — Z6830 Body mass index (BMI) 30.0-30.9, adult: Secondary | ICD-10-CM | POA: Diagnosis not present

## 2024-07-29 DIAGNOSIS — E669 Obesity, unspecified: Secondary | ICD-10-CM | POA: Diagnosis not present

## 2024-07-29 DIAGNOSIS — E1169 Type 2 diabetes mellitus with other specified complication: Secondary | ICD-10-CM | POA: Diagnosis not present

## 2024-07-29 DIAGNOSIS — K219 Gastro-esophageal reflux disease without esophagitis: Secondary | ICD-10-CM | POA: Diagnosis not present

## 2024-07-29 DIAGNOSIS — E785 Hyperlipidemia, unspecified: Secondary | ICD-10-CM | POA: Diagnosis not present

## 2024-07-29 DIAGNOSIS — E559 Vitamin D deficiency, unspecified: Secondary | ICD-10-CM | POA: Diagnosis not present

## 2024-07-29 DIAGNOSIS — G629 Polyneuropathy, unspecified: Secondary | ICD-10-CM | POA: Diagnosis not present

## 2024-11-04 DIAGNOSIS — M25532 Pain in left wrist: Secondary | ICD-10-CM | POA: Diagnosis not present

## 2024-11-04 DIAGNOSIS — K219 Gastro-esophageal reflux disease without esophagitis: Secondary | ICD-10-CM | POA: Diagnosis not present

## 2024-11-04 DIAGNOSIS — K2270 Barrett's esophagus without dysplasia: Secondary | ICD-10-CM | POA: Diagnosis not present

## 2024-11-04 DIAGNOSIS — G629 Polyneuropathy, unspecified: Secondary | ICD-10-CM | POA: Diagnosis not present

## 2024-11-04 DIAGNOSIS — E669 Obesity, unspecified: Secondary | ICD-10-CM | POA: Diagnosis not present

## 2024-11-04 DIAGNOSIS — E1169 Type 2 diabetes mellitus with other specified complication: Secondary | ICD-10-CM | POA: Diagnosis not present

## 2024-11-04 DIAGNOSIS — Z23 Encounter for immunization: Secondary | ICD-10-CM | POA: Diagnosis not present

## 2024-11-04 DIAGNOSIS — E559 Vitamin D deficiency, unspecified: Secondary | ICD-10-CM | POA: Diagnosis not present

## 2024-11-04 DIAGNOSIS — E785 Hyperlipidemia, unspecified: Secondary | ICD-10-CM | POA: Diagnosis not present

## 2024-11-04 DIAGNOSIS — G43009 Migraine without aura, not intractable, without status migrainosus: Secondary | ICD-10-CM | POA: Diagnosis not present

## 2024-12-17 DIAGNOSIS — K2270 Barrett's esophagus without dysplasia: Secondary | ICD-10-CM | POA: Diagnosis not present

## 2024-12-17 DIAGNOSIS — R1114 Bilious vomiting: Secondary | ICD-10-CM | POA: Diagnosis not present
